# Patient Record
Sex: Male | Born: 2005 | Race: White | Hispanic: No | Marital: Single | Smoking: Never smoker
Health system: Southern US, Community
[De-identification: ages and names within clinical notes are randomized; demographics above are authoritative.]

---
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
## 2023-07-15 ENCOUNTER — Ambulatory Visit
Admission: RE | Admit: 2023-07-15 | Discharge: 2023-07-15 | Disposition: A | Discharge status: Home / Self Care | Source: Ambulatory Visit | Attending: Family Medicine | Admitting: Family Medicine

## 2023-07-15 VITALS — BP 117/77 | HR 98 | Temp 97.9°F | Resp 16 | Wt 185.0 lb

## 2023-07-15 DIAGNOSIS — Z4802 Encounter for removal of sutures: Secondary | ICD-10-CM | POA: Diagnosis not present

## 2023-07-15 NOTE — ED Provider Notes (Signed)
  [facility]  Note:  This document was prepared using Conservation officer, historic buildings and may include unintentional dictation errors.  MRN: [id number] DOB: [birth year]  Subjective:   Robert Hull is a 18 y.o. male presenting for suture removal from laceration to the left foot.  Initial sutures were placed on 06/30/2023.  No current facility-administered medications for this encounter.  Current Outpatient Medications:    cephALEXin (KEFLEX) 500 MG capsule, Take 500 mg by mouth 4 (four) times daily., Disp: , Rfl:    ibuprofen (ADVIL,MOTRIN) 600 MG tablet, Take 1 tablet (600 mg total) by mouth every 8 (eight) hours as needed., Disp: 30 tablet, Rfl: 0   ipratropium (ATROVENT) 0.06 % nasal spray, Place 2 sprays into both nostrils 4 (four) times daily as needed for rhinitis., Disp: 15 mL, Rfl: 0   loratadine (CLARITIN) 10 MG tablet, Take 1 tablet (10 mg total) by mouth daily as needed for allergies., Disp: 30 tablet, Rfl: 0   No Known Allergies  History reviewed. No pertinent past medical history.   History reviewed. No pertinent surgical history.  Family History  Problem Relation Age of Onset   Healthy Mother    Hypertension Father    Sleep apnea Father     Social History   Tobacco Use   Smoking status: Never   Smokeless tobacco: Never  Vaping Use   Vaping status: Never Used  Substance Use Topics   Alcohol use: No   Drug use: No    ROS Refer to HPI for ROS details.  Objective:   Vitals: BP 117/77 (BP Location: Left Arm)   Pulse 98   Temp 97.9 F (36.6 C) (Oral)   Resp 16   Wt 184 lb 15.5 oz (83.9 kg)   SpO2 98%   BMI 26.16 kg/m   Physical Exam Vitals and nursing note reviewed.  Constitutional:      General: He is not in acute distress.    Appearance: He is well-developed. He is not ill-appearing or toxic-appearing.  HENT:     Head: Normocephalic.  Cardiovascular:     Rate and Rhythm: Normal rate.  Pulmonary:     Effort: Pulmonary effort is  normal. No respiratory distress.  Skin:    General: Skin is warm and dry.     Findings: Wound (8 sutures removed from lateral aspect of left foot, 1 suture retained.  Unable to remove due to patient discomfort.) present.  Neurological:     General: No focal deficit present.     Mental Status: He is alert and oriented to person, place, and time.  Psychiatric:        Mood and Affect: Mood normal.        Behavior: Behavior normal.     Procedures  No results found for this or any previous visit (from the past 24 hours).  Assessment and Plan :     Discharge Instructions      1. Visit for suture removal (Primary) - 8 sutures removed from lateral aspect of left foot following suture placement on 06/30/2023 in ER.  1 suture retained due to increased pain with attempted removal. - No secondary sign of infection noted, wound well-approximated, moderate amount of dead skin noted to the external portion of the wound, bandage placed over wound site for protection and to reduce risk for secondary infection.    Lucky Cowboy   [place], [place], [place] 07/15/23 [phone]

## 2023-07-15 NOTE — Discharge Instructions (Addendum)
 1. Visit for suture removal (Primary) - 8 sutures removed from lateral aspect of left foot following suture placement on 06/30/2023 in ER.  1 suture retained due to increased pain with attempted removal. - No secondary sign of infection noted, wound well-approximated, moderate amount of dead skin noted to the external portion of the wound, bandage placed over wound site for protection and to reduce risk for secondary infection.

## 2023-07-15 NOTE — ED Triage Notes (Signed)
 Pt presents with 9 sutures to be removed from foot. Sutures were completed at [facility] in [place] 15 days ago.

## 2023-10-27 NOTE — ED Provider Notes (Signed)
 [facility] Emergency Department Provider Note   ED Clinical Impression   Final diagnoses:  Chest pain, unspecified type (Primary)     Impression, Medical Decision Making, ED Course   Impression: 18 y.o. male with a past medical history of anxiety and ADHD who presents with 1.5 weeks of waxing and waning chest pain radiating to the left arm as described below.   On exam, patient is nontoxic appearing, and in no acute distress. Vitals are unremarkable; patient is hemodynamically stable, afebrile. Physical exam benign. LCTAB.  DDx/MDM:   From an ACS standpoint, the patient has a HEART score of 0  and has a reassuring EKG. Will further evaluate for this possibility with serial troponin levels to include a measurement drawn at least 6 hours from the onset of pain. Will evaluate for pneumonia and pneumothorax with a chest x-ray.  I have a low suspicion for aortic dissection given that pain is not ripping or radiating to the back and the patient does not appear to have a widened mediastinum on chest x-ray. No risk factors such as recent endoscopy or frequent heavy retching to suggest Borhaave Syndrome.   Diagnostic workup as below. Will treat patient with aspirin.   Orders Placed This Encounter  Procedures  . XR Chest 2 views  . CBC w/ Differential  . Comprehensive Metabolic Panel  . PT-INR  . hsTroponin I (serial 0-2-6H w/ delta)  . hsTroponin I - 2 Hour  . hsTroponin I - 6 Hour  . Ambulatory referral to [facility]  . ECG 12 Lead  . ECG 12 Lead  . ECG 12 Lead  . Insert peripheral IV       Discussion with other professionals: None Independent interpretation: EKG(s) -   X-ray(s) - negative for acute process  I have reviewed recent and relavant previous record, including: Outpatient notes - 12/04/22 [facility] note for PMH Social Determinants that significantly affected care: None    History   Chief Complaint Chief Complaint  Patient presents with  . Chest Pain     HPI  Robert Hull is a 18 y.o. male with past medical history of anxiety and ADHD who presents with chest pain. 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 He notes that his chest pain is mostly left-sided and intermittently radiates to the right with no sternal involvement. His pain is occasionally exacerbated with movement but is not affected post-prandially. On evaluation, the patient additionally reports mild nausea and abdominal pain, which he attributes to anxiety. Denies dizziness, blurred vision, or back pain increased from baseline  Outside Historian(s): None  Past Medical History[1]  Past Surgical History[2]   Current Facility-Administered Medications:  .  aspirin chewable tablet 324 mg, 324 mg, Oral, Once, Charlie Krystal BIRCH, MD No current outpatient medications on file.  Allergies Patient has no known allergies.  Family History Family History[3]  Social History Short Social History[4]   Physical Exam   VITAL SIGNS:    Vitals:   10/27/23 1055 10/27/23 1056 10/27/23 1518  BP:  130/81 117/71  Pulse: 91  69  Resp: 18  16  Temp: 36.3 C (97.3 F)    TempSrc: Temporal    SpO2: 98%  100%    Constitutional: Alert and oriented. No acute distress. Eyes: Conjunctivae are normal. HEENT: Normocephalic and atraumatic. Conjunctivae clear. No congestion. Moist mucous membranes.  Cardiovascular: Rate as above, regular rhythm.  Normal and symmetric distal pulses. Brisk capillary refill. Normal skin turgor. Respiratory: Normal respiratory effort. Breath sounds are normal. There are no wheezing or crackles heard. Gastrointestinal: Soft, non-distended, non-tender. Genitourinary: Deferred. Musculoskeletal: Non-tender with normal range of motion in all extremities. Neurologic: Normal speech and language. No gross focal neurologic deficits are appreciated. Patient is moving all  extremities equally, face is symmetric at rest and with speech. Skin: Skin is warm, dry and intact. No rash noted. Psychiatric: Mood and affect are normal. Speech and behavior are normal.   Radiology   XR Chest 2 views  Final Result    No findings of acute cardiopulmonary disease               Pertinent labs & imaging results that were available during my care of the patient were independently interpreted by me and considered in my medical decision making (see chart for details).  Portions of this record have been created using Scientist, clinical (histocompatibility and immunogenetics). Dictation errors have been sought, but may not have been identified and corrected.  Documentation assistance was provided by Learta Loots, Scribe on October 27, 2023 at 3:13 PM for Mabel Blunt, FNP.   Documentation assistance provided by the above mentioned scribe. I was present during the time the encounter was recorded. The information recorded by the scribe was done at my direction and has been reviewed and validated by me.          [1] Past Medical History: Diagnosis Date  . ADHD (attention deficit hyperactivity disorder)    ADD  [2] No past surgical history on file. [3] Family History Problem Relation Age of Onset  . Hypertension Father   . Hyperlipidemia Father   . No Known Problems Sister   . Cancer Maternal Grandmother        Thyroid cancer  . Hemochromatosis Maternal Grandfather   . Cancer Maternal Aunt        Thyroid  . Thyroid disease Maternal Aunt        Hypothyroidism  [4] Social History Tobacco Use  . Smoking status: Never  . Smokeless tobacco: Never  Vaping Use  . Vaping status: Never Used  Substance Use Topics  . Alcohol use: No  . Drug use: No   Blunt Mabel Barrio, FNP 10/27/23 1655

## 2023-12-08 ENCOUNTER — Ambulatory Visit
Admission: RE | Admit: 2023-12-08 | Discharge: 2023-12-08 | Disposition: A | Discharge status: Home / Self Care | Payer: Self-pay | Attending: Emergency Medicine | Admitting: Emergency Medicine

## 2023-12-08 VITALS — BP 123/81 | HR 68 | Temp 98.9°F | Resp 19

## 2023-12-08 DIAGNOSIS — L237 Allergic contact dermatitis due to plants, except food: Secondary | ICD-10-CM

## 2023-12-08 MED ORDER — DEXAMETHASONE SODIUM PHOSPHATE 10 MG/ML IJ SOLN
10.0000 mg | Freq: Once | INTRAMUSCULAR | Status: AC
  Administered 2023-12-08: 10 mg via INTRAMUSCULAR

## 2023-12-08 MED ORDER — PREDNISONE 10 MG (21) PO TBPK: ORAL_TABLET | Freq: Every day | ORAL | 0 refills | Status: DC

## 2023-12-08 NOTE — ED Provider Notes (Signed)
 [facility]    CSN: [id number] Arrival date & time: 12/08/23  1016      History   Chief Complaint Chief Complaint  Patient presents with   Poison Ivy    Entered by patient    HPI Robert Hull is a 18 y.o. male.  Accompanied by his mother, patient presents with a pruritic rash on his face, arms, groin, legs x 3 days which started after he came in contact with poison ivy.  Overnight he developed swelling and rash around his eyes.  No lesions in his eyes or mouth.  No eye drainage, change in vision, eye pain, fever, chills.  Patient took 1 dose of Benadryl 2 days ago.  The history is provided by the patient, a parent and medical records.    History reviewed. No pertinent past medical history.  Patient Active Problem List   Diagnosis Date Noted   Moderate anxiety 02/08/2020   Panic disorder 02/08/2020   Childhood obesity 12/11/2015    History reviewed. No pertinent surgical history.     Home Medications    Prior to Admission medications   Medication Sig Start Date End Date Taking? Authorizing Provider  predniSONE  (STERAPRED UNI-PAK 21 TAB) 10 MG (21) TBPK tablet Take by mouth daily. As directed 12/09/23  Yes Corlis Burnard DEL, NP  cephALEXin (KEFLEX) 500 MG capsule Take 500 mg by mouth 4 (four) times daily. Patient not taking: Reported on 12/08/2023 06/30/23   [provider]  ibuprofen  (ADVIL ,MOTRIN ) 600 MG tablet Take 1 tablet (600 mg total) by mouth every 8 (eight) hours as needed. Patient not taking: Reported on 12/08/2023 05/11/18   Cook, Jayce G, DO  ipratropium (ATROVENT ) 0.06 % nasal spray Place 2 sprays into both nostrils 4 (four) times daily as needed for rhinitis. Patient not taking: Reported on 12/08/2023 05/11/18   Cook, Jayce G, DO  loratadine  (CLARITIN ) 10 MG tablet Take 1 tablet (10 mg total) by mouth daily as needed for allergies. Patient not taking: Reported on 12/08/2023 05/11/18   Cook, Jayce G, DO    Family History Family History   Problem Relation Age of Onset   Healthy Mother    Hypertension Father    Sleep apnea Father     Social History Social History   Tobacco Use   Smoking status: Never   Smokeless tobacco: Never  Vaping Use   Vaping status: Never Used  Substance Use Topics   Alcohol use: No   Drug use: No     Allergies   Patient has no known allergies.   Review of Systems Review of Systems  Constitutional:  Negative for chills and fever.  HENT:  Positive for facial swelling. Negative for sore throat, trouble swallowing and voice change.   Eyes:  Positive for pain, discharge and visual disturbance.  Respiratory:  Negative for cough and shortness of breath.   Skin:  Positive for color change and rash.     Physical Exam Triage Vital Signs ED Triage Vitals  Encounter Vitals Group     BP 12/08/23 1024 123/81     Girls Systolic BP Percentile --      Girls Diastolic BP Percentile --      Boys Systolic BP Percentile --      Boys Diastolic BP Percentile --      Pulse Rate 12/08/23 1024 68     Resp 12/08/23 1024 19     Temp 12/08/23 1024 98.9 F (37.2 C)     Temp src --  SpO2 12/08/23 1024 98 %     Weight --      Height --      Head Circumference --      Peak Flow --      Pain Score 12/08/23 1021 3     Pain Loc --      Pain Education --      Exclude from Growth Chart --    No data found.  Updated Vital Signs BP 123/81   Pulse 68   Temp 98.9 F (37.2 C)   Resp 19   SpO2 98%   Visual Acuity Right Eye Distance:   Left Eye Distance:   Bilateral Distance:    Right Eye Near:   Left Eye Near:    Bilateral Near:     Physical Exam Constitutional:      General: He is not in acute distress. HENT:     Head:     Comments: Erythematous rash on face with generalized edema, especially around the left eye.    Mouth/Throat:     Mouth: Mucous membranes are moist.  Eyes:     General:        Right eye: No discharge.        Left eye: No discharge.     Extraocular Movements:  Extraocular movements intact.     Conjunctiva/sclera: Conjunctivae normal.     Pupils: Pupils are equal, round, and reactive to light.  Cardiovascular:     Rate and Rhythm: Normal rate and regular rhythm.  Pulmonary:     Effort: Pulmonary effort is normal. No respiratory distress.  Skin:    General: Skin is warm and dry.     Findings: Rash present.     Comments: Erythematous patchy and vesicular rash on face, arms, legs.  Patient declined exam of his groin area today.  Neurological:     Mental Status: He is alert.      UC Treatments / Results  Labs (all labs ordered are listed, but only abnormal results are displayed) Labs Reviewed - No data to display  EKG   Radiology No results found.  Procedures Procedures (including critical care time)  Medications Ordered in UC Medications  dexamethasone  (DECADRON ) injection 10 mg (10 mg Intramuscular Given 12/08/23 1056)    Initial Impression / Assessment and Plan / UC Course  I have reviewed the triage vital signs and the nursing notes.  Pertinent labs & imaging results that were available during my care of the patient were reviewed by me and considered in my medical decision making (see chart for details).    Poison ivy dermatitis.  Afebrile and vital signs are stable.  No eye lesions or mouth lesions.  Dexamethasone  given here and starting prednisone  taper tomorrow.  Discussed daily use of antihistamine such as Claritin  in the morning and Zyrtec  at night x 14 days.  Strict ED precautions discussed.  Education provided on poison ivy dermatitis.  Instructed patient to follow-up with his PCP.  He is accompanied by his mother today.  They agree to plan of care.  Final Clinical Impressions(s) / UC Diagnoses   Final diagnoses:  Poison ivy dermatitis     Discharge Instructions      You were given an injection of a steroid called dexamethasone .  Start the prednisone  taper tomorrow as directed.    Take Zyrtec  as directed.     Follow up with your primary care provider.   Go to the emergency department if you have worsening symptoms.  ED Prescriptions     Medication Sig Dispense Auth. Provider   predniSONE  (STERAPRED UNI-PAK 21 TAB) 10 MG (21) TBPK tablet Take by mouth daily. As directed 21 tablet Corlis Burnard DEL, NP      PDMP not reviewed this encounter.   Corlis Burnard DEL, NP 12/08/23 1104

## 2023-12-08 NOTE — Discharge Instructions (Addendum)
You were given an injection of a steroid called dexamethasone.  Start the prednisone taper tomorrow as directed.    Take Zyrtec as directed.    Follow up with your primary care provider.   Go to the emergency department if you have worsening symptoms.

## 2023-12-08 NOTE — ED Triage Notes (Signed)
 Patient to Urgent Care with complaints of rash after poison ivy exposure. Rash present to face/ eyes/ groin.   Symptoms x3 days.  Has attempted various otc meds including benadryl.

## 2023-12-17 ENCOUNTER — Encounter: Payer: Self-pay | Admitting: Emergency Medicine

## 2023-12-17 ENCOUNTER — Ambulatory Visit
Admission: EM | Admit: 2023-12-17 | Discharge: 2023-12-17 | Disposition: A | Discharge status: Home / Self Care | Attending: Emergency Medicine | Admitting: Emergency Medicine

## 2023-12-17 ENCOUNTER — Ambulatory Visit: Payer: Self-pay

## 2023-12-17 DIAGNOSIS — L247 Irritant contact dermatitis due to plants, except food: Secondary | ICD-10-CM

## 2023-12-17 MED ORDER — DEXAMETHASONE SODIUM PHOSPHATE 10 MG/ML IJ SOLN
10.0000 mg | Freq: Once | INTRAMUSCULAR | Status: AC
  Administered 2023-12-17: 10 mg via INTRAMUSCULAR

## 2023-12-17 MED ORDER — PREDNISONE 10 MG (21) PO TBPK: ORAL_TABLET | ORAL | 0 refills | Status: AC

## 2023-12-17 NOTE — ED Triage Notes (Signed)
 Patient reports poison ivy for 12 days.  Patient states that the rash has spread to his abdomen and thighs.

## 2023-12-17 NOTE — Discharge Instructions (Signed)
Take the prednisone according to the package instructions.  Use over-the-counter Allegra, Claritin, or Zyrtec during the day as needed for itching and use Benadryl 50 mg at bedtime.  This may also help you sleep as a steroids may interrupt your sleep cycle.  Apply calamine lotion to the rash on your extremities to help dry it up.  Do not use calamine lotion on your face.  For facial lesions, if you develop any changes in your vision or itching and irritation in your eyes please go to the ER for evaluation or follow-up with ophthalmology.  

## 2023-12-17 NOTE — ED Provider Notes (Signed)
 [facility]    CSN: [id number] Arrival date & time: 12/17/23  1717      History   Chief Complaint Chief Complaint  Patient presents with   Rash    HPI Robert Hull is a 18 y.o. male.   HPI  18 year old male with past medical history significant for moderate anxiety and panic disorder presents for evaluation of ongoing poison ivy rash.  He reports that the initial contact with poison ivy occurred 12 days ago.  He initially went to be evaluated for the rash because he developed itching and swelling around his eyes and on his face.  He was placed on a 6-day prednisone  taper which did not resolve the rash.  He reports that the lesions on his left hand are draining a clear fluid.  None of the rest of the rash is draining.  He denies fever.  History reviewed. No pertinent past medical history.  Patient Active Problem List   Diagnosis Date Noted   Moderate anxiety 02/08/2020   Panic disorder 02/08/2020   Childhood obesity 12/11/2015    History reviewed. No pertinent surgical history.     Home Medications    Prior to Admission medications   Medication Sig Start Date End Date Taking? Authorizing Provider  predniSONE  (STERAPRED UNI-PAK 21 TAB) 10 MG (21) TBPK tablet Take 6 tablets on day 1, 5 tablets day 2, 4 tablets day 3, 3 tablets day 4, 2 tablets day 5, 1 tablet day 6 12/17/23  Yes Bernardino Ditch, NP  ibuprofen  (ADVIL ,MOTRIN ) 600 MG tablet Take 1 tablet (600 mg total) by mouth every 8 (eight) hours as needed. Patient not taking: Reported on 12/08/2023 05/11/18   Cook, Jayce G, DO  ipratropium (ATROVENT ) 0.06 % nasal spray Place 2 sprays into both nostrils 4 (four) times daily as needed for rhinitis. Patient not taking: Reported on 12/08/2023 05/11/18   Cook, Jayce G, DO  loratadine  (CLARITIN ) 10 MG tablet Take 1 tablet (10 mg total) by mouth daily as needed for allergies. Patient not taking: Reported on 12/08/2023 05/11/18   Cook, Jayce G, DO    Family  History Family History  Problem Relation Age of Onset   Healthy Mother    Hypertension Father    Sleep apnea Father     Social History Social History   Tobacco Use   Smoking status: Never   Smokeless tobacco: Never  Vaping Use   Vaping status: Never Used  Substance Use Topics   Alcohol use: No   Drug use: No     Allergies   Patient has no known allergies.   Review of Systems Review of Systems  Constitutional:  Negative for fever.  Skin:  Positive for rash.     Physical Exam Triage Vital Signs ED Triage Vitals  Encounter Vitals Group     BP 12/17/23 1736 129/75     Girls Systolic BP Percentile --      Girls Diastolic BP Percentile --      Boys Systolic BP Percentile --      Boys Diastolic BP Percentile --      Pulse Rate 12/17/23 1736 96     Resp 12/17/23 1736 15     Temp 12/17/23 1736 98.7 F (37.1 C)     Temp Source 12/17/23 1736 Oral     SpO2 12/17/23 1736 96 %     Weight 12/17/23 1734 184 lb 15.5 oz (83.9 kg)     Height 12/17/23 1734 5' 10 (1.778 m)  Head Circumference --      Peak Flow --      Pain Score 12/17/23 1734 0     Pain Loc --      Pain Education --      Exclude from Growth Chart --    No data found.  Updated Vital Signs BP 129/75 (BP Location: Right Arm)   Pulse 96   Temp 98.7 F (37.1 C) (Oral)   Resp 15   Ht 5' 10 (1.778 m)   Wt 184 lb 15.5 oz (83.9 kg)   SpO2 96%   BMI 26.54 kg/m   Visual Acuity Right Eye Distance:   Left Eye Distance:   Bilateral Distance:    Right Eye Near:   Left Eye Near:    Bilateral Near:     Physical Exam Vitals and nursing note reviewed.  Constitutional:      Appearance: Normal appearance. He is not ill-appearing.  HENT:     Head: Normocephalic and atraumatic.  Skin:    General: Skin is warm and dry.     Capillary Refill: Capillary refill takes less than 2 seconds.     Findings: Erythema and rash present.  Neurological:     General: No focal deficit present.     Mental Status: He  is alert and oriented to person, place, and time.      UC Treatments / Results  Labs (all labs ordered are listed, but only abnormal results are displayed) Labs Reviewed - No data to display  EKG   Radiology No results found.  Procedures Procedures (including critical care time)  Medications Ordered in UC Medications  dexamethasone  (DECADRON ) injection 10 mg (has no administration in time range)    Initial Impression / Assessment and Plan / UC Course  I have reviewed the triage vital signs and the nursing notes.  Pertinent labs & imaging results that were available during my care of the patient were reviewed by me and considered in my medical decision making (see chart for details).   Patient is a nontoxic-appearing 18 year old male presenting for evaluation of ongoing Contac dermatitis on his chest, both arms, and in his groin x 12 days.  He completed a 6-day prednisone  taper 3 to 4 days ago with mild improvement of his symptoms.  He initially had lesions on his face and those have resolved.      As you can see in images above, the patient still has lesions on his fingers, arms, and chest.  He is also reporting lesions in the inguinal creases bilaterally.  I will place him on another 6-day prednisone  taper and jumpstart him with a 10 mg IM Decadron  shot here in clinic.  I have also encouraged him to use calamine lotion to help dry up the remainder of the rash.  If he develops any fever, pus drainage, or red streaking he should return for reevaluation.    Final Clinical Impressions(s) / UC Diagnoses   Final diagnoses:  Irritant contact dermatitis due to plants, except food     Discharge Instructions      Take the prednisone  according to the package instructions.  Use over-the-counter Allegra, Claritin , or Zyrtec  during the day as needed for itching and use Benadryl 50 mg at bedtime.  This may also help you sleep as a steroids may interrupt your sleep cycle.  Apply  calamine lotion to the rash on your extremities to help dry it up.  Do not use calamine lotion on your face.  For  facial lesions, if you develop any changes in your vision or itching and irritation in your eyes please go to the ER for evaluation or follow-up with ophthalmology.      ED Prescriptions     Medication Sig Dispense Auth. Provider   predniSONE  (STERAPRED UNI-PAK 21 TAB) 10 MG (21) TBPK tablet Take 6 tablets on day 1, 5 tablets day 2, 4 tablets day 3, 3 tablets day 4, 2 tablets day 5, 1 tablet day 6 21 tablet Bernardino Ditch, NP      PDMP not reviewed this encounter.   Bernardino Ditch, NP 12/17/23 1750

## 2024-02-03 ENCOUNTER — Encounter: Payer: Self-pay | Admitting: Emergency Medicine
